# Patient Record
Sex: Female | Born: 1935 | Race: White | Hispanic: No
Health system: Southern US, Community
[De-identification: ages and names within clinical notes are randomized; demographics above are authoritative.]

## PROBLEM LIST (undated history)

## (undated) DIAGNOSIS — I509 Heart failure, unspecified: Secondary | ICD-10-CM

## (undated) DIAGNOSIS — I10 Essential (primary) hypertension: Secondary | ICD-10-CM

---

## 2003-08-29 ENCOUNTER — Ambulatory Visit (HOSPITAL_COMMUNITY): Admission: RE | Admit: 2003-08-29 | Discharge: 2003-08-29 | Payer: Self-pay | Admitting: Pulmonary Disease

## 2004-03-10 ENCOUNTER — Other Ambulatory Visit: Admission: RE | Admit: 2004-03-10 | Discharge: 2004-03-10 | Payer: Self-pay | Admitting: Dermatology

## 2004-12-07 ENCOUNTER — Inpatient Hospital Stay (HOSPITAL_COMMUNITY): Admission: EM | Admit: 2004-12-07 | Discharge: 2004-12-18 | Payer: Self-pay | Admitting: Emergency Medicine

## 2004-12-10 ENCOUNTER — Ambulatory Visit: Payer: Self-pay | Admitting: *Deleted

## 2005-02-23 ENCOUNTER — Ambulatory Visit: Payer: Self-pay | Admitting: Cardiology

## 2005-03-23 ENCOUNTER — Ambulatory Visit: Payer: Self-pay | Admitting: Cardiology

## 2005-03-30 ENCOUNTER — Ambulatory Visit: Payer: Self-pay | Admitting: *Deleted

## 2005-04-15 ENCOUNTER — Ambulatory Visit: Payer: Self-pay | Admitting: *Deleted

## 2005-05-06 ENCOUNTER — Ambulatory Visit: Payer: Self-pay | Admitting: *Deleted

## 2005-06-10 ENCOUNTER — Ambulatory Visit: Payer: Self-pay | Admitting: *Deleted

## 2005-07-09 ENCOUNTER — Ambulatory Visit: Payer: Self-pay | Admitting: *Deleted

## 2005-08-14 ENCOUNTER — Ambulatory Visit: Payer: Self-pay | Admitting: Cardiology

## 2005-08-25 ENCOUNTER — Ambulatory Visit: Payer: Self-pay | Admitting: *Deleted

## 2005-09-10 ENCOUNTER — Ambulatory Visit: Payer: Self-pay | Admitting: Internal Medicine

## 2005-09-15 ENCOUNTER — Ambulatory Visit: Payer: Self-pay | Admitting: *Deleted

## 2005-10-02 ENCOUNTER — Ambulatory Visit: Payer: Self-pay | Admitting: Cardiology

## 2005-11-05 ENCOUNTER — Ambulatory Visit (HOSPITAL_COMMUNITY): Admission: RE | Admit: 2005-11-05 | Discharge: 2005-11-05 | Payer: Self-pay | Admitting: Cardiology

## 2005-11-05 ENCOUNTER — Ambulatory Visit: Payer: Self-pay | Admitting: Cardiology

## 2005-12-28 ENCOUNTER — Ambulatory Visit (HOSPITAL_COMMUNITY): Admission: RE | Admit: 2005-12-28 | Discharge: 2005-12-28 | Payer: Self-pay | Admitting: Ophthalmology

## 2006-06-14 ENCOUNTER — Ambulatory Visit (HOSPITAL_COMMUNITY): Admission: RE | Admit: 2006-06-14 | Discharge: 2006-06-14 | Payer: Self-pay | Admitting: Ophthalmology

## 2009-11-26 ENCOUNTER — Inpatient Hospital Stay (HOSPITAL_COMMUNITY): Admission: EM | Admit: 2009-11-26 | Discharge: 2009-11-28 | Payer: Self-pay | Admitting: Emergency Medicine

## 2009-11-28 ENCOUNTER — Inpatient Hospital Stay: Admission: AD | Admit: 2009-11-28 | Discharge: 2010-03-04 | Payer: Self-pay | Admitting: Family Medicine

## 2009-12-11 ENCOUNTER — Ambulatory Visit (HOSPITAL_COMMUNITY): Admission: RE | Admit: 2009-12-11 | Discharge: 2009-12-11 | Payer: Self-pay | Admitting: Obstetrics and Gynecology

## 2010-01-08 ENCOUNTER — Ambulatory Visit (HOSPITAL_COMMUNITY): Admission: RE | Admit: 2010-01-08 | Discharge: 2010-01-08 | Payer: Self-pay | Admitting: Orthopaedic Surgery

## 2010-01-21 ENCOUNTER — Ambulatory Visit (HOSPITAL_COMMUNITY): Admission: RE | Admit: 2010-01-21 | Discharge: 2010-01-21 | Payer: Self-pay | Admitting: Orthopaedic Surgery

## 2010-02-11 ENCOUNTER — Encounter (HOSPITAL_COMMUNITY): Admission: RE | Admit: 2010-02-11 | Discharge: 2010-03-13 | Payer: Self-pay | Admitting: Pulmonary Disease

## 2010-02-12 ENCOUNTER — Ambulatory Visit (HOSPITAL_COMMUNITY): Admission: RE | Admit: 2010-02-12 | Discharge: 2010-02-12 | Payer: Self-pay | Admitting: Orthopaedic Surgery

## 2010-03-19 ENCOUNTER — Ambulatory Visit (HOSPITAL_COMMUNITY): Admission: RE | Admit: 2010-03-19 | Discharge: 2010-03-19 | Payer: Self-pay | Admitting: Orthopaedic Surgery

## 2010-04-27 ENCOUNTER — Inpatient Hospital Stay (HOSPITAL_COMMUNITY)
Admission: EM | Admit: 2010-04-27 | Discharge: 2010-05-01 | Payer: Self-pay | Source: Home / Self Care | Admitting: Emergency Medicine

## 2010-04-28 ENCOUNTER — Ambulatory Visit: Payer: Self-pay | Admitting: Cardiology

## 2010-05-20 ENCOUNTER — Emergency Department (HOSPITAL_COMMUNITY)
Admission: EM | Admit: 2010-05-20 | Discharge: 2010-05-21 | Payer: Self-pay | Source: Home / Self Care | Admitting: Emergency Medicine

## 2010-06-13 ENCOUNTER — Inpatient Hospital Stay (HOSPITAL_COMMUNITY): Admission: EM | Admit: 2010-06-13 | Discharge: 2010-06-18 | Payer: Self-pay | Admitting: Emergency Medicine

## 2010-06-16 IMAGING — CR DG ANKLE COMPLETE 3+V*R*
3 series · 3 of 3 positions shown · non-contrast
Comparison: Ankle radiograph 01/21/2010

CLINICAL DATA: Follow-up right ankle fracture

RIGHT ANKLE - COMPLETE 3+ VIEW

[view not recorded (1 of 3)]
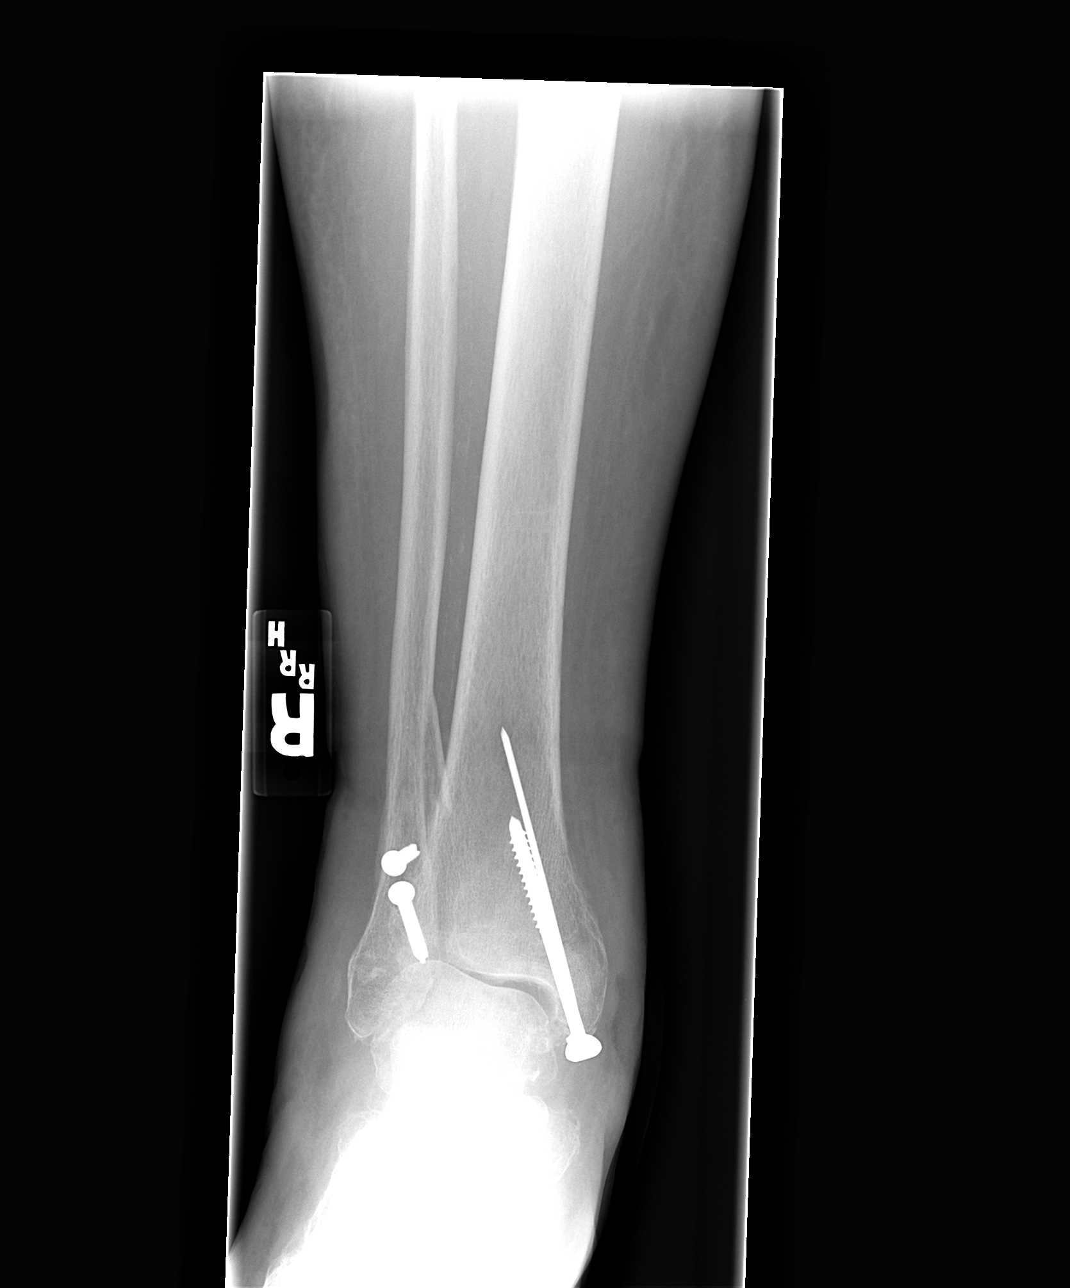

[view not recorded (2 of 3)]
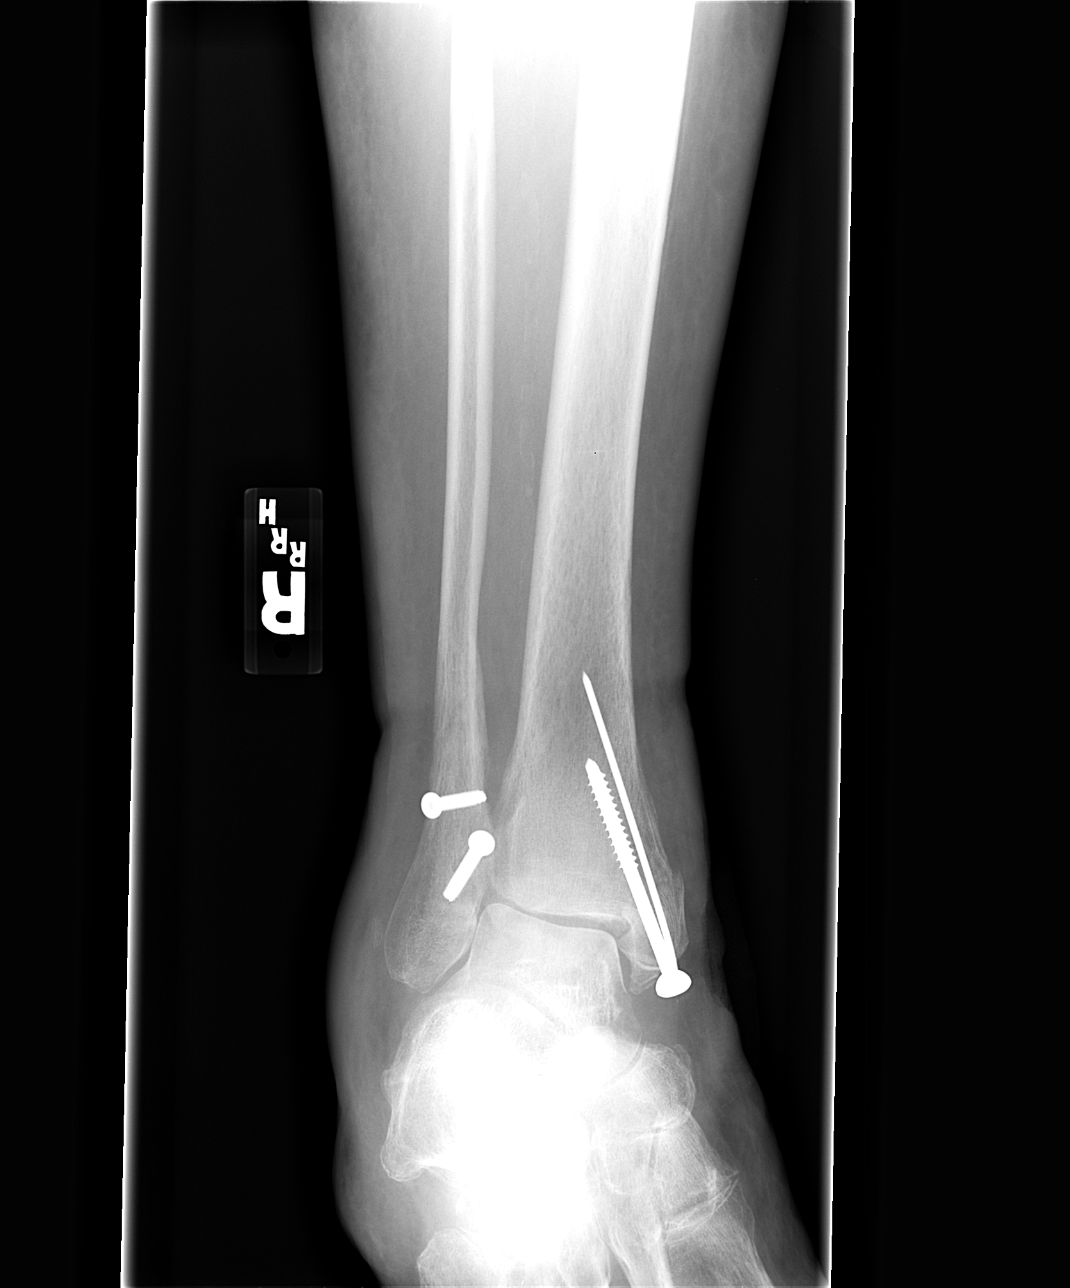

[view not recorded (3 of 3)]
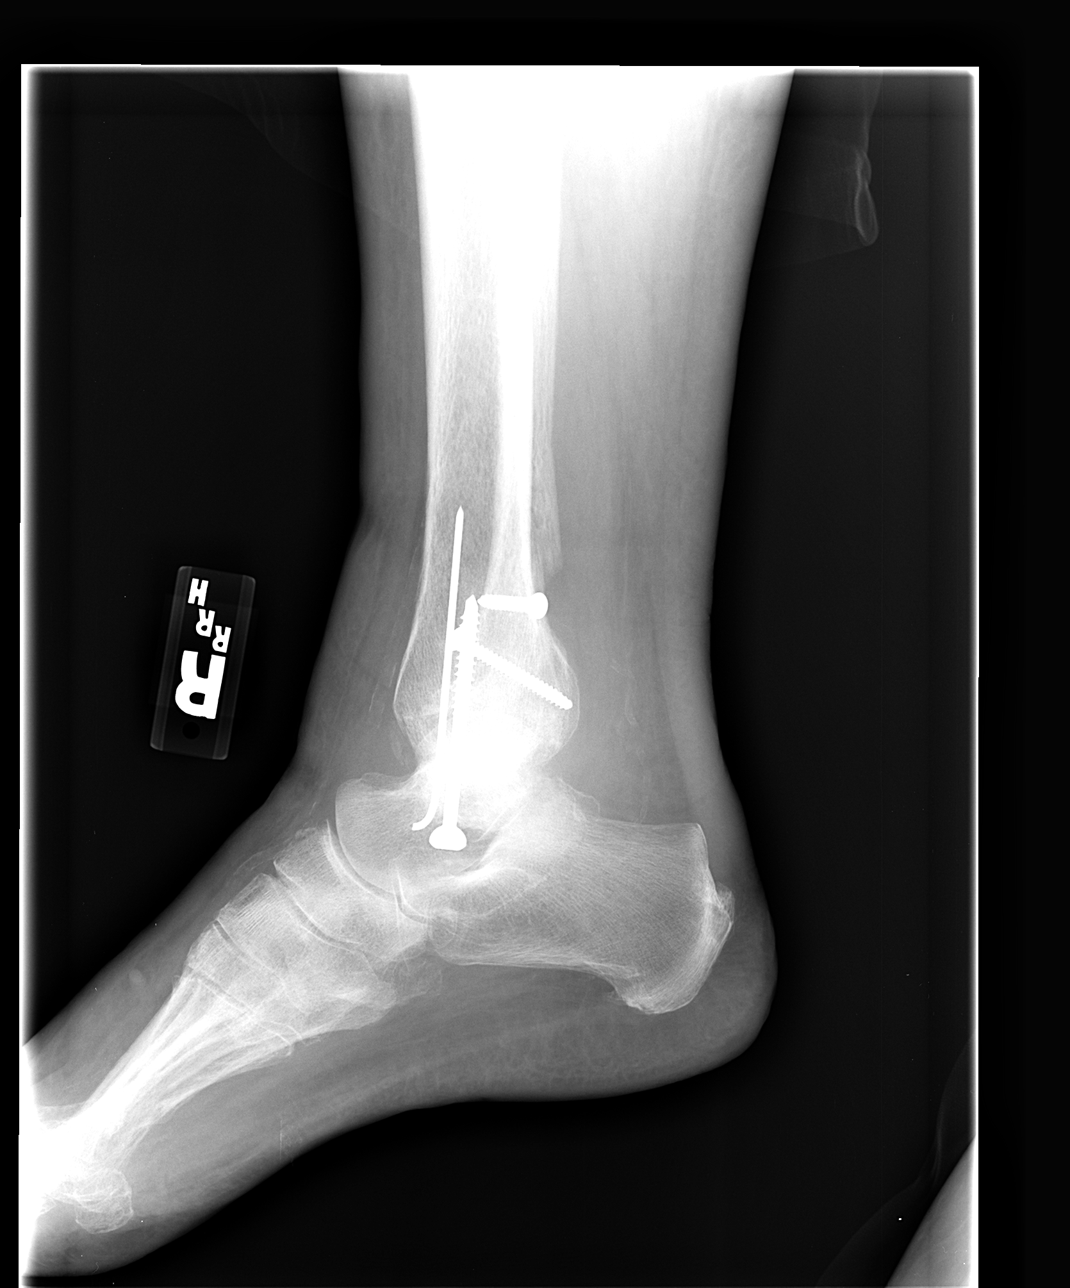

[3 of 3 positions shown; findings below may reference images not displayed]

FINDINGS: Stable appearance of the two screws in the distal fibula
and and screw and 10 in the medial malleolus.

There is persistent mild widening of the mortise medially.  Medial
malleolar fracture lines are still visible and unchanged.  The
distal fibula fracture lines are not well visualized, however there
is slight medial displacement of a fracture fragment just superior
to the level of the screws, stable.

No interval change compared to 01/21/2010.
IMPRESSION: 1.  Incomplete old bleed healed medial malleolus fracture, with
stable orthopedic hardware.
2.  Slight medial displacement of a fracture fragment of the distal
fibula, unchanged.
3.  Persistent mild widening of the mortise.

## 2010-07-05 ENCOUNTER — Emergency Department (HOSPITAL_COMMUNITY): Admission: EM | Admit: 2010-07-05 | Discharge: 2010-07-05 | Payer: Self-pay | Admitting: Emergency Medicine

## 2010-07-21 IMAGING — CR DG ANKLE COMPLETE 3+V*R*
3 series · 3 of 3 positions shown · non-contrast
Comparison: 02/12/2010

CLINICAL DATA: Status post trimalleolar fracture right ankle
October 2009, open wound

RIGHT ANKLE - COMPLETE 3+ VIEW

[view not recorded (1 of 3)]
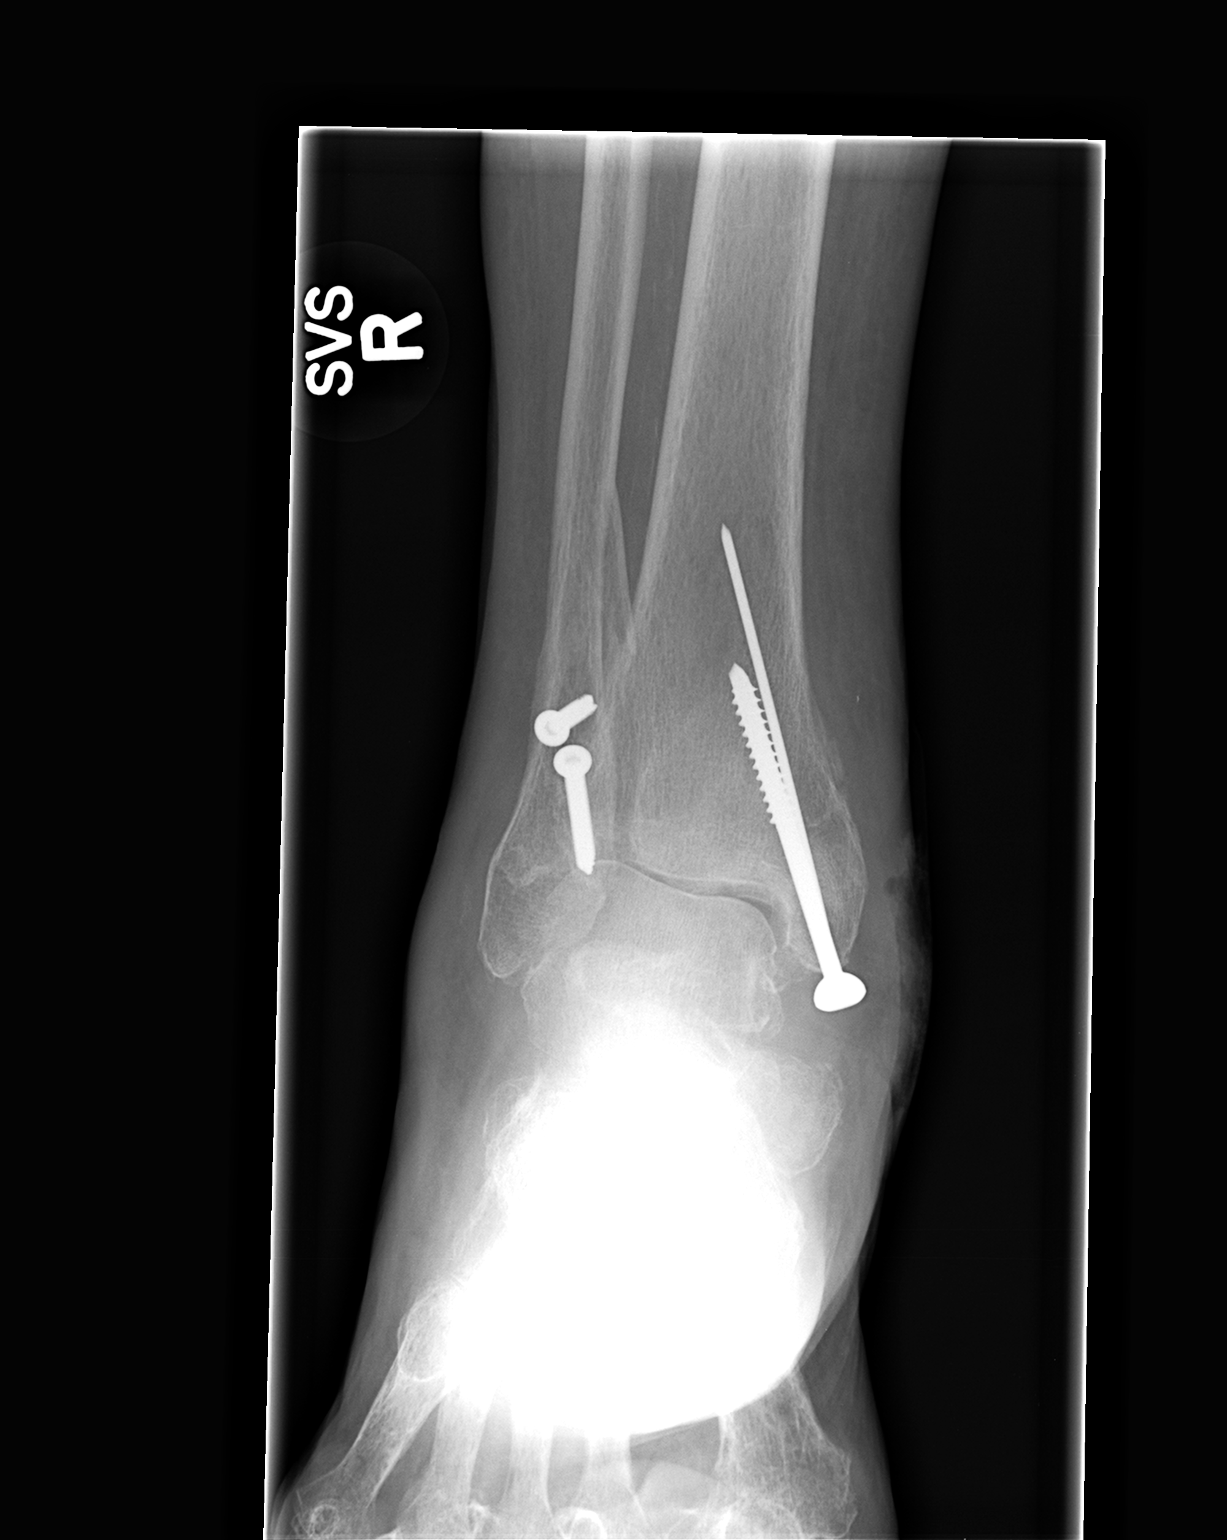

[view not recorded (2 of 3)]
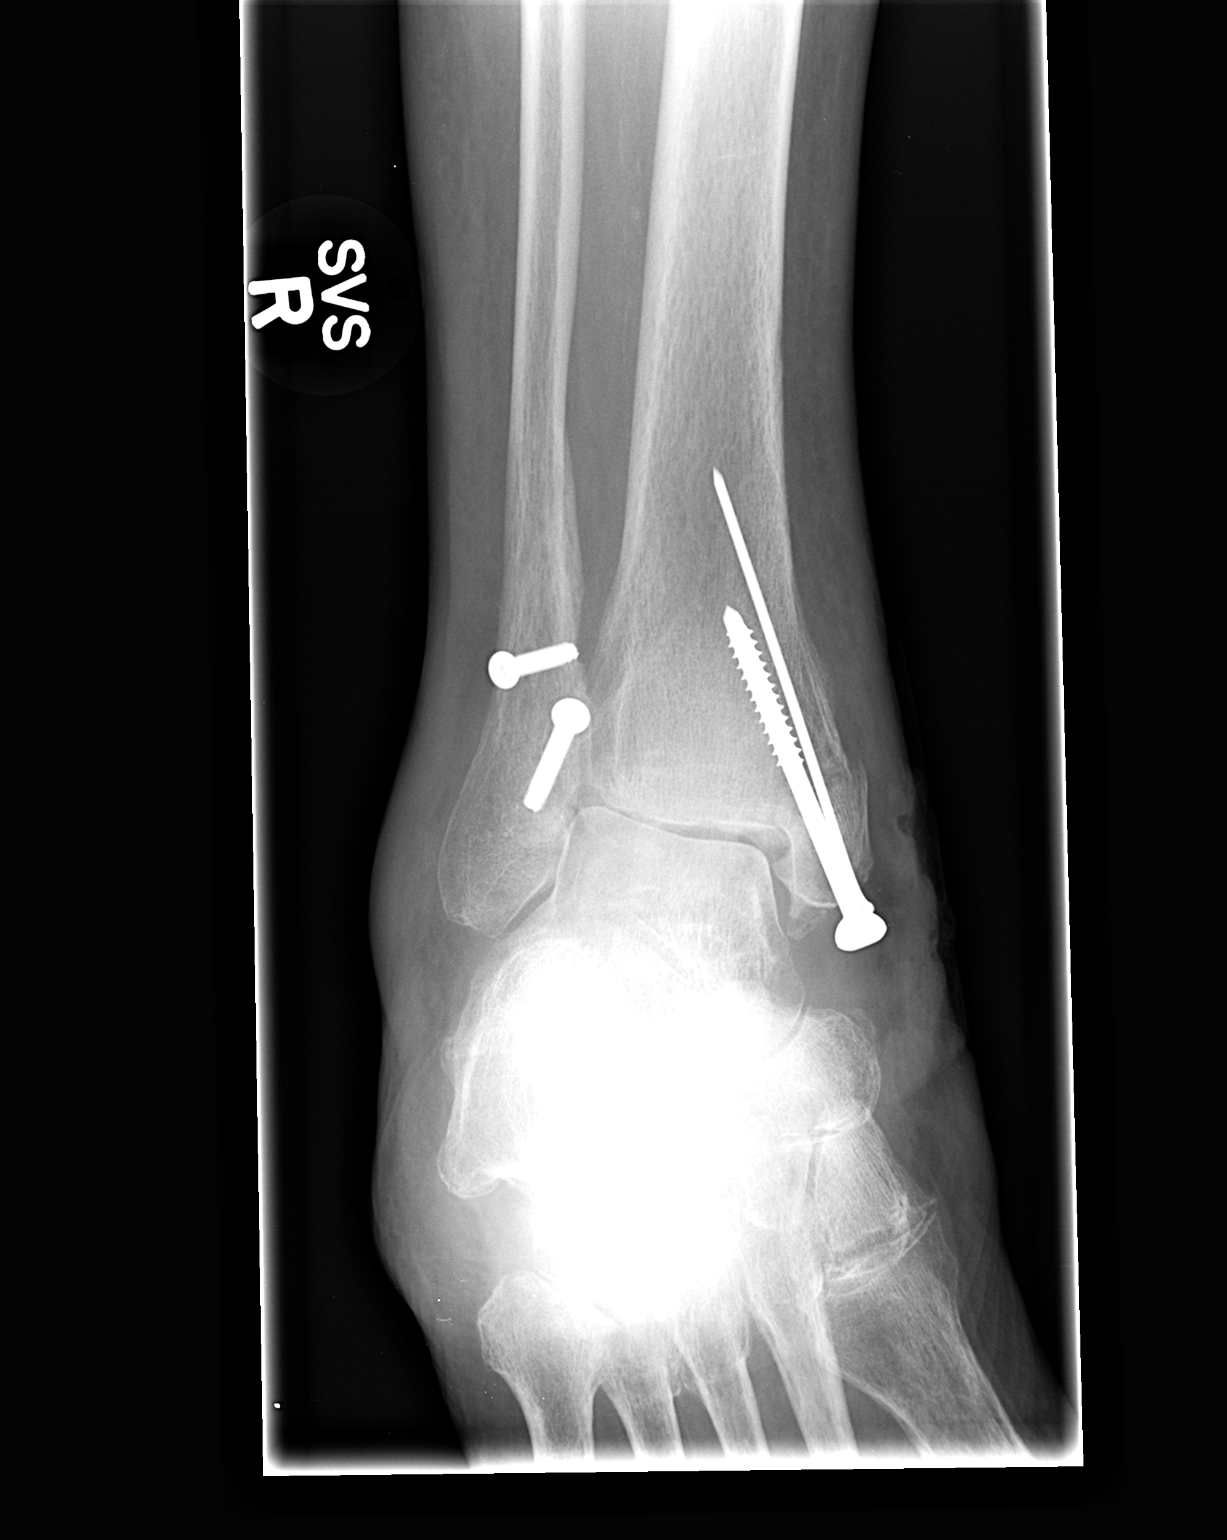

[view not recorded (3 of 3)]
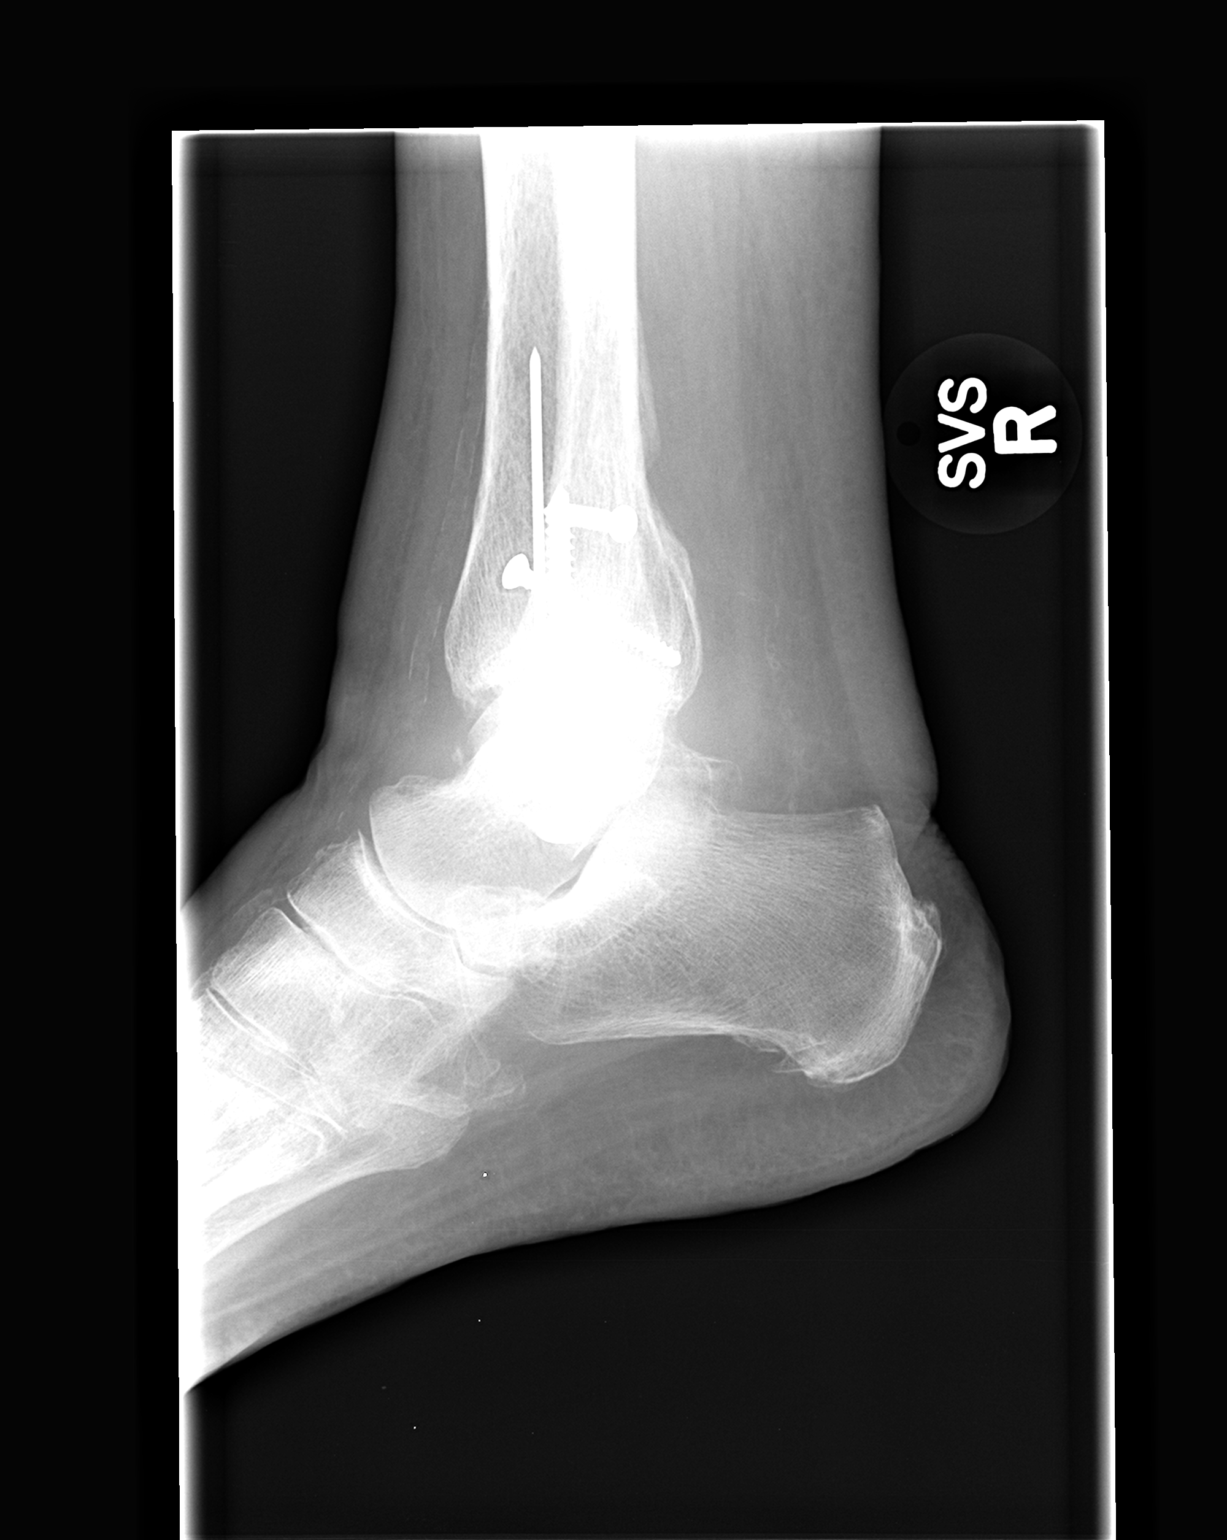

[3 of 3 positions shown; findings below may reference images not displayed]

FINDINGS: Pin and screw identified across medial malleolus.
Two screws present at distal fibula.
Interval continued healing of trimalleolar fractures.
Regional soft tissue swelling.
Mild scattered small vessel vascular calcification.
Minor calcaneal spurring.
Soft tissue irregularity and questionable ulceration at medial
aspect of the right ankle overly the medial malleolus.
No definite destructive bony process or periosteal reaction seen to
suggest acute osteomyelitis.
Mild asymmetric narrowing of the lateral aspect of the ankle
mortise.
IMPRESSION: Healing trimalleolar fractures right ankle, though medial malleolar
fracture plane remains radiographically evident.
Suspect soft tissue ulcer at medial soft tissues overlying medial
malleolus, without definite bony destruction seen to suggest acute
osteomyelitis.

## 2010-08-02 ENCOUNTER — Emergency Department (HOSPITAL_COMMUNITY): Admission: EM | Admit: 2010-08-02 | Discharge: 2010-08-02 | Payer: Self-pay | Admitting: Emergency Medicine

## 2010-09-02 ENCOUNTER — Ambulatory Visit (HOSPITAL_COMMUNITY): Admission: RE | Admit: 2010-09-02 | Discharge: 2010-09-02 | Payer: Self-pay | Admitting: Pulmonary Disease

## 2011-01-16 ENCOUNTER — Emergency Department (HOSPITAL_COMMUNITY)
Admission: EM | Admit: 2011-01-16 | Discharge: 2011-01-16 | Disposition: A | Discharge status: Home / Self Care | Payer: Medicare Other | Attending: Emergency Medicine | Admitting: Emergency Medicine

## 2011-01-16 ENCOUNTER — Emergency Department (HOSPITAL_COMMUNITY): Payer: Medicare Other

## 2011-01-16 DIAGNOSIS — F29 Unspecified psychosis not due to a substance or known physiological condition: Secondary | ICD-10-CM | POA: Insufficient documentation

## 2011-01-16 DIAGNOSIS — Z8673 Personal history of transient ischemic attack (TIA), and cerebral infarction without residual deficits: Secondary | ICD-10-CM | POA: Insufficient documentation

## 2011-01-16 DIAGNOSIS — I509 Heart failure, unspecified: Secondary | ICD-10-CM | POA: Insufficient documentation

## 2011-01-16 DIAGNOSIS — Z79899 Other long term (current) drug therapy: Secondary | ICD-10-CM | POA: Insufficient documentation

## 2011-01-16 DIAGNOSIS — I4891 Unspecified atrial fibrillation: Secondary | ICD-10-CM | POA: Insufficient documentation

## 2011-01-16 DIAGNOSIS — E119 Type 2 diabetes mellitus without complications: Secondary | ICD-10-CM | POA: Insufficient documentation

## 2011-01-16 DIAGNOSIS — I10 Essential (primary) hypertension: Secondary | ICD-10-CM | POA: Insufficient documentation

## 2011-01-16 DIAGNOSIS — R4182 Altered mental status, unspecified: Secondary | ICD-10-CM | POA: Insufficient documentation

## 2011-01-16 DIAGNOSIS — R404 Transient alteration of awareness: Secondary | ICD-10-CM | POA: Insufficient documentation

## 2011-01-16 LAB — URINALYSIS, ROUTINE W REFLEX MICROSCOPIC
Bilirubin Urine: NEGATIVE
Ketones, ur: NEGATIVE mg/dL
Nitrite: NEGATIVE
Specific Gravity, Urine: 1.02 (ref 1.005–1.030)
Urobilinogen, UA: 0.2 mg/dL (ref 0.0–1.0)

## 2011-01-16 LAB — CK TOTAL AND CKMB (NOT AT ARMC): Total CK: 17 U/L (ref 7–177)

## 2011-01-16 LAB — COMPREHENSIVE METABOLIC PANEL
ALT: 12 U/L (ref 0–35)
Alkaline Phosphatase: 38 U/L — ABNORMAL LOW (ref 39–117)
BUN: 32 mg/dL — ABNORMAL HIGH (ref 6–23)
CO2: 25 mEq/L (ref 19–32)
Chloride: 107 mEq/L (ref 96–112)
Glucose, Bld: 130 mg/dL — ABNORMAL HIGH (ref 70–99)
Potassium: 3.4 mEq/L — ABNORMAL LOW (ref 3.5–5.1)
Sodium: 139 mEq/L (ref 135–145)
Total Bilirubin: 0.6 mg/dL (ref 0.3–1.2)

## 2011-01-16 LAB — CBC
HCT: 41.3 % (ref 36.0–46.0)
Hemoglobin: 13.4 g/dL (ref 12.0–15.0)
MCH: 28 pg (ref 26.0–34.0)
MCHC: 32.4 g/dL (ref 30.0–36.0)
MCV: 86.4 fL (ref 78.0–100.0)
RBC: 4.78 MIL/uL (ref 3.87–5.11)

## 2011-01-16 LAB — DIFFERENTIAL
Basophils Relative: 0 % (ref 0–1)
Lymphs Abs: 2.2 10*3/uL (ref 0.7–4.0)
Monocytes Absolute: 0.9 10*3/uL (ref 0.1–1.0)
Monocytes Relative: 9 % (ref 3–12)
Neutro Abs: 5.8 10*3/uL (ref 1.7–7.7)

## 2011-01-16 LAB — TROPONIN I

## 2011-01-16 LAB — URINE MICROSCOPIC-ADD ON

## 2011-01-17 LAB — TSH: TSH: 2.614 u[IU]/mL (ref 0.350–4.500)

## 2011-01-18 LAB — URINE CULTURE
Culture  Setup Time: 201202180115
Culture: NO GROWTH

## 2011-02-13 LAB — URINE MICROSCOPIC-ADD ON

## 2011-02-13 LAB — COMPREHENSIVE METABOLIC PANEL
ALT: 18 U/L (ref 0–35)
AST: 24 U/L (ref 0–37)
Alkaline Phosphatase: 34 U/L — ABNORMAL LOW (ref 39–117)
CO2: 29 mEq/L (ref 19–32)
Chloride: 101 mEq/L (ref 96–112)
GFR calc non Af Amer: 55 mL/min — ABNORMAL LOW (ref >60)
Glucose, Bld: 96 mg/dL (ref 70–99)
Potassium: 4.1 mEq/L (ref 3.5–5.1)
Sodium: 137 mEq/L (ref 135–145)
Total Bilirubin: 0.8 mg/dL (ref 0.3–1.2)

## 2011-02-13 LAB — URINALYSIS, ROUTINE W REFLEX MICROSCOPIC
Bilirubin Urine: NEGATIVE
Ketones, ur: NEGATIVE mg/dL
Specific Gravity, Urine: 1.01 (ref 1.005–1.030)
pH: 7 (ref 5.0–8.0)

## 2011-02-13 LAB — DIFFERENTIAL
Basophils Absolute: 0 10*3/uL (ref 0.0–0.1)
Eosinophils Absolute: 0.6 10*3/uL (ref 0.0–0.7)
Eosinophils Relative: 8 % — ABNORMAL HIGH (ref 0–5)
Lymphocytes Relative: 26 % (ref 12–46)
Monocytes Absolute: 0.7 10*3/uL (ref 0.1–1.0)

## 2011-02-13 LAB — CBC
Hemoglobin: 13.2 g/dL (ref 12.0–15.0)
Platelets: 359 10*3/uL (ref 150–400)
RBC: 4.68 MIL/uL (ref 3.87–5.11)
WBC: 6.8 10*3/uL (ref 4.0–10.5)

## 2011-02-14 LAB — BASIC METABOLIC PANEL
BUN: 22 mg/dL (ref 6–23)
CO2: 25 mEq/L (ref 19–32)
CO2: 28 mEq/L (ref 19–32)
Calcium: 8.8 mg/dL (ref 8.4–10.5)
Calcium: 9 mg/dL (ref 8.4–10.5)
Calcium: 9.2 mg/dL (ref 8.4–10.5)
Chloride: 99 mEq/L (ref 96–112)
Creatinine, Ser: 0.93 mg/dL (ref 0.4–1.2)
Creatinine, Ser: 0.98 mg/dL (ref 0.4–1.2)
Creatinine, Ser: 1.15 mg/dL (ref 0.4–1.2)
GFR calc Af Amer: >60 mL/min (ref >60)
GFR calc Af Amer: >60 mL/min (ref >60)
GFR calc non Af Amer: 45 mL/min — ABNORMAL LOW (ref >60)
GFR calc non Af Amer: 57 mL/min — ABNORMAL LOW (ref >60)
Glucose, Bld: 120 mg/dL — ABNORMAL HIGH (ref 70–99)
Glucose, Bld: 124 mg/dL — ABNORMAL HIGH (ref 70–99)
Potassium: 3.9 mEq/L (ref 3.5–5.1)

## 2011-02-14 LAB — GLUCOSE, CAPILLARY
Glucose-Capillary: 122 mg/dL — ABNORMAL HIGH (ref 70–99)
Glucose-Capillary: 125 mg/dL — ABNORMAL HIGH (ref 70–99)
Glucose-Capillary: 135 mg/dL — ABNORMAL HIGH (ref 70–99)
Glucose-Capillary: 136 mg/dL — ABNORMAL HIGH (ref 70–99)
Glucose-Capillary: 137 mg/dL — ABNORMAL HIGH (ref 70–99)
Glucose-Capillary: 98 mg/dL (ref 70–99)

## 2011-02-14 LAB — CBC
MCH: 27.7 pg (ref 26.0–34.0)
MCH: 27.9 pg (ref 26.0–34.0)
MCH: 27.9 pg (ref 26.0–34.0)
MCHC: 33.4 g/dL (ref 30.0–36.0)
MCV: 82.6 fL (ref 78.0–100.0)
Platelets: 322 10*3/uL (ref 150–400)
Platelets: 325 10*3/uL (ref 150–400)
Platelets: 334 10*3/uL (ref 150–400)
RBC: 4.12 MIL/uL (ref 3.87–5.11)
RBC: 4.29 MIL/uL (ref 3.87–5.11)
RDW: 17.9 % — ABNORMAL HIGH (ref 11.5–15.5)
WBC: 6.5 10*3/uL (ref 4.0–10.5)
WBC: 9.4 10*3/uL (ref 4.0–10.5)

## 2011-02-14 LAB — DIFFERENTIAL
Basophils Absolute: 0 10*3/uL (ref 0.0–0.1)
Basophils Relative: 1 % (ref 0–1)
Eosinophils Absolute: 0.2 10*3/uL (ref 0.0–0.7)
Eosinophils Absolute: 0.3 10*3/uL (ref 0.0–0.7)
Eosinophils Absolute: 0.4 10*3/uL (ref 0.0–0.7)
Eosinophils Relative: 3 % (ref 0–5)
Lymphocytes Relative: 21 % (ref 12–46)
Lymphocytes Relative: 22 % (ref 12–46)
Lymphs Abs: 2 10*3/uL (ref 0.7–4.0)
Neutro Abs: 6.2 10*3/uL (ref 1.7–7.7)
Neutrophils Relative %: 61 % (ref 43–77)
Neutrophils Relative %: 67 % (ref 43–77)

## 2011-02-14 LAB — PREPARE FRESH FROZEN PLASMA

## 2011-02-14 LAB — URINALYSIS, ROUTINE W REFLEX MICROSCOPIC
Glucose, UA: NEGATIVE mg/dL
Ketones, ur: NEGATIVE mg/dL
Nitrite: NEGATIVE
Specific Gravity, Urine: 1.02 (ref 1.005–1.030)
pH: 6 (ref 5.0–8.0)

## 2011-02-14 LAB — CULTURE, BLOOD (ROUTINE X 2): Culture: NO GROWTH

## 2011-02-14 LAB — URINE CULTURE
Colony Count: NO GROWTH
Culture: NO GROWTH

## 2011-02-14 LAB — PROTIME-INR
INR: 1.81 — ABNORMAL HIGH (ref 0.00–1.49)
INR: 1.86 — ABNORMAL HIGH (ref 0.00–1.49)
INR: >10 (ref 0.00–1.49)
Prothrombin Time: 20.8 seconds — ABNORMAL HIGH (ref 11.6–15.2)
Prothrombin Time: 21.3 seconds — ABNORMAL HIGH (ref 11.6–15.2)

## 2011-02-14 LAB — VANCOMYCIN, TROUGH: Vancomycin Tr: 11.5 ug/mL (ref 10.0–20.0)

## 2011-02-15 LAB — BASIC METABOLIC PANEL
GFR calc non Af Amer: 39 mL/min — ABNORMAL LOW (ref >60)
Glucose, Bld: 169 mg/dL — ABNORMAL HIGH (ref 70–99)
Potassium: 3.8 mEq/L (ref 3.5–5.1)
Sodium: 136 mEq/L (ref 135–145)

## 2011-02-15 LAB — GLUCOSE, CAPILLARY
Glucose-Capillary: 107 mg/dL — ABNORMAL HIGH (ref 70–99)
Glucose-Capillary: 113 mg/dL — ABNORMAL HIGH (ref 70–99)
Glucose-Capillary: 117 mg/dL — ABNORMAL HIGH (ref 70–99)
Glucose-Capillary: 135 mg/dL — ABNORMAL HIGH (ref 70–99)
Glucose-Capillary: 136 mg/dL — ABNORMAL HIGH (ref 70–99)
Glucose-Capillary: 138 mg/dL — ABNORMAL HIGH (ref 70–99)
Glucose-Capillary: 99 mg/dL (ref 70–99)
Glucose-Capillary: 104 mg/dL — ABNORMAL HIGH (ref 70–99)
Glucose-Capillary: 105 mg/dL — ABNORMAL HIGH (ref 70–99)
Glucose-Capillary: 105 mg/dL — ABNORMAL HIGH (ref 70–99)
Glucose-Capillary: 110 mg/dL — ABNORMAL HIGH (ref 70–99)
Glucose-Capillary: 112 mg/dL — ABNORMAL HIGH (ref 70–99)
Glucose-Capillary: 119 mg/dL — ABNORMAL HIGH (ref 70–99)
Glucose-Capillary: 125 mg/dL — ABNORMAL HIGH (ref 70–99)
Glucose-Capillary: 166 mg/dL — ABNORMAL HIGH (ref 70–99)
Glucose-Capillary: 93 mg/dL (ref 70–99)

## 2011-02-15 LAB — CBC
HCT: 37.6 % (ref 36.0–46.0)
Hemoglobin: 12.5 g/dL (ref 12.0–15.0)
RDW: 17.4 % — ABNORMAL HIGH (ref 11.5–15.5)
WBC: 6.3 10*3/uL (ref 4.0–10.5)

## 2011-02-15 LAB — DIFFERENTIAL
Eosinophils Relative: 6 % — ABNORMAL HIGH (ref 0–5)
Lymphocytes Relative: 24 % (ref 12–46)
Lymphs Abs: 1.5 10*3/uL (ref 0.7–4.0)
Monocytes Absolute: 0.7 10*3/uL (ref 0.1–1.0)
Monocytes Relative: 11 % (ref 3–12)

## 2011-02-16 LAB — COMPREHENSIVE METABOLIC PANEL
ALT: 22 U/L (ref 0–35)
AST: 21 U/L (ref 0–37)
Albumin: 3.9 g/dL (ref 3.5–5.2)
Alkaline Phosphatase: 40 U/L (ref 39–117)
Chloride: 98 mEq/L (ref 96–112)
GFR calc Af Amer: 57 mL/min — ABNORMAL LOW (ref >60)
Potassium: 3.9 mEq/L (ref 3.5–5.1)
Sodium: 137 mEq/L (ref 135–145)
Total Bilirubin: 0.4 mg/dL (ref 0.3–1.2)

## 2011-02-16 LAB — GLUCOSE, CAPILLARY
Glucose-Capillary: 105 mg/dL — ABNORMAL HIGH (ref 70–99)
Glucose-Capillary: 121 mg/dL — ABNORMAL HIGH (ref 70–99)
Glucose-Capillary: 92 mg/dL (ref 70–99)
Glucose-Capillary: 97 mg/dL (ref 70–99)
Glucose-Capillary: 72 mg/dL (ref 70–99)
Glucose-Capillary: 85 mg/dL (ref 70–99)
Glucose-Capillary: 87 mg/dL (ref 70–99)

## 2011-02-16 LAB — PROTIME-INR
INR: 1.09 (ref 0.00–1.49)
Prothrombin Time: 14 seconds (ref 11.6–15.2)
Prothrombin Time: 14.3 seconds (ref 11.6–15.2)

## 2011-02-16 LAB — URINE CULTURE: Colony Count: NO GROWTH

## 2011-02-16 LAB — TROPONIN I: Troponin I: 0.02 ng/mL (ref 0.00–0.06)

## 2011-02-16 LAB — CBC
Platelets: 333 10*3/uL (ref 150–400)
WBC: 7 10*3/uL (ref 4.0–10.5)

## 2011-02-16 LAB — DIFFERENTIAL
Basophils Absolute: 0 10*3/uL (ref 0.0–0.1)
Basophils Relative: 1 % (ref 0–1)
Eosinophils Relative: 5 % (ref 0–5)
Lymphocytes Relative: 24 % (ref 12–46)
Monocytes Absolute: 1 10*3/uL (ref 0.1–1.0)

## 2011-02-16 LAB — URINALYSIS, ROUTINE W REFLEX MICROSCOPIC
Bilirubin Urine: NEGATIVE
Hgb urine dipstick: NEGATIVE
Specific Gravity, Urine: <1.005 — ABNORMAL LOW (ref 1.005–1.030)
pH: 7 (ref 5.0–8.0)

## 2011-02-16 LAB — CK TOTAL AND CKMB (NOT AT ARMC): CK, MB: 0.7 ng/mL (ref 0.3–4.0)

## 2011-02-16 LAB — APTT: aPTT: 30 seconds (ref 24–37)

## 2011-02-18 LAB — GLUCOSE, CAPILLARY
Glucose-Capillary: 104 mg/dL — ABNORMAL HIGH (ref 70–99)
Glucose-Capillary: 109 mg/dL — ABNORMAL HIGH (ref 70–99)
Glucose-Capillary: 112 mg/dL — ABNORMAL HIGH (ref 70–99)
Glucose-Capillary: 115 mg/dL — ABNORMAL HIGH (ref 70–99)
Glucose-Capillary: 116 mg/dL — ABNORMAL HIGH (ref 70–99)
Glucose-Capillary: 116 mg/dL — ABNORMAL HIGH (ref 70–99)
Glucose-Capillary: 118 mg/dL — ABNORMAL HIGH (ref 70–99)
Glucose-Capillary: 118 mg/dL — ABNORMAL HIGH (ref 70–99)
Glucose-Capillary: 118 mg/dL — ABNORMAL HIGH (ref 70–99)
Glucose-Capillary: 119 mg/dL — ABNORMAL HIGH (ref 70–99)
Glucose-Capillary: 128 mg/dL — ABNORMAL HIGH (ref 70–99)
Glucose-Capillary: 131 mg/dL — ABNORMAL HIGH (ref 70–99)
Glucose-Capillary: 131 mg/dL — ABNORMAL HIGH (ref 70–99)
Glucose-Capillary: 146 mg/dL — ABNORMAL HIGH (ref 70–99)
Glucose-Capillary: 147 mg/dL — ABNORMAL HIGH (ref 70–99)
Glucose-Capillary: 86 mg/dL (ref 70–99)
Glucose-Capillary: 96 mg/dL (ref 70–99)

## 2011-02-22 LAB — GLUCOSE, CAPILLARY
Glucose-Capillary: 100 mg/dL — ABNORMAL HIGH (ref 70–99)
Glucose-Capillary: 101 mg/dL — ABNORMAL HIGH (ref 70–99)
Glucose-Capillary: 104 mg/dL — ABNORMAL HIGH (ref 70–99)
Glucose-Capillary: 106 mg/dL — ABNORMAL HIGH (ref 70–99)
Glucose-Capillary: 106 mg/dL — ABNORMAL HIGH (ref 70–99)
Glucose-Capillary: 108 mg/dL — ABNORMAL HIGH (ref 70–99)
Glucose-Capillary: 116 mg/dL — ABNORMAL HIGH (ref 70–99)
Glucose-Capillary: 119 mg/dL — ABNORMAL HIGH (ref 70–99)
Glucose-Capillary: 121 mg/dL — ABNORMAL HIGH (ref 70–99)
Glucose-Capillary: 122 mg/dL — ABNORMAL HIGH (ref 70–99)
Glucose-Capillary: 124 mg/dL — ABNORMAL HIGH (ref 70–99)
Glucose-Capillary: 124 mg/dL — ABNORMAL HIGH (ref 70–99)
Glucose-Capillary: 86 mg/dL (ref 70–99)
Glucose-Capillary: 95 mg/dL (ref 70–99)

## 2011-03-02 LAB — BASIC METABOLIC PANEL
BUN: 18 mg/dL (ref 6–23)
BUN: 19 mg/dL (ref 6–23)
CO2: 29 mEq/L (ref 19–32)
CO2: 29 mEq/L (ref 19–32)
CO2: 29 mEq/L (ref 19–32)
Calcium: 8.6 mg/dL (ref 8.4–10.5)
Calcium: 9.4 mg/dL (ref 8.4–10.5)
Chloride: 95 mEq/L — ABNORMAL LOW (ref 96–112)
Chloride: 96 mEq/L (ref 96–112)
Creatinine, Ser: 0.96 mg/dL (ref 0.4–1.2)
Creatinine, Ser: 1.07 mg/dL (ref 0.4–1.2)
GFR calc Af Amer: 56 mL/min — ABNORMAL LOW (ref >60)
GFR calc Af Amer: 59 mL/min — ABNORMAL LOW (ref >60)
GFR calc Af Amer: >60 mL/min (ref >60)
GFR calc non Af Amer: 46 mL/min — ABNORMAL LOW (ref >60)
Glucose, Bld: 177 mg/dL — ABNORMAL HIGH (ref 70–99)
Potassium: 3.3 mEq/L — ABNORMAL LOW (ref 3.5–5.1)
Potassium: 3.9 mEq/L (ref 3.5–5.1)
Sodium: 130 mEq/L — ABNORMAL LOW (ref 135–145)
Sodium: 137 mEq/L (ref 135–145)

## 2011-03-02 LAB — DIFFERENTIAL
Basophils Relative: 0 % (ref 0–1)
Eosinophils Absolute: 0.2 10*3/uL (ref 0.0–0.7)
Eosinophils Absolute: 0.4 10*3/uL (ref 0.0–0.7)
Eosinophils Relative: 2 % (ref 0–5)
Eosinophils Relative: 4 % (ref 0–5)
Lymphocytes Relative: 11 % — ABNORMAL LOW (ref 12–46)
Lymphocytes Relative: 13 % (ref 12–46)
Lymphs Abs: 1.4 10*3/uL (ref 0.7–4.0)
Lymphs Abs: 1.6 10*3/uL (ref 0.7–4.0)
Monocytes Absolute: 1.1 10*3/uL — ABNORMAL HIGH (ref 0.1–1.0)
Monocytes Relative: 10 % (ref 3–12)
Monocytes Relative: 11 % (ref 3–12)
Monocytes Relative: 7 % (ref 3–12)
Neutro Abs: 11.4 10*3/uL — ABNORMAL HIGH (ref 1.7–7.7)
Neutro Abs: 8.8 10*3/uL — ABNORMAL HIGH (ref 1.7–7.7)
Neutrophils Relative %: 75 % (ref 43–77)
Neutrophils Relative %: 80 % — ABNORMAL HIGH (ref 43–77)

## 2011-03-02 LAB — CBC
HCT: 27.8 % — ABNORMAL LOW (ref 36.0–46.0)
HCT: 32.6 % — ABNORMAL LOW (ref 36.0–46.0)
Hemoglobin: 9.9 g/dL — ABNORMAL LOW (ref 12.0–15.0)
MCHC: 34.5 g/dL (ref 30.0–36.0)
MCV: 88.1 fL (ref 78.0–100.0)
MCV: 89 fL (ref 78.0–100.0)
Platelets: 240 10*3/uL (ref 150–400)
RBC: 3.15 MIL/uL — ABNORMAL LOW (ref 3.87–5.11)
RBC: 3.32 MIL/uL — ABNORMAL LOW (ref 3.87–5.11)
RBC: 4.28 MIL/uL (ref 3.87–5.11)
WBC: 11 10*3/uL — ABNORMAL HIGH (ref 4.0–10.5)
WBC: 11.7 10*3/uL — ABNORMAL HIGH (ref 4.0–10.5)
WBC: 14.2 10*3/uL — ABNORMAL HIGH (ref 4.0–10.5)

## 2011-03-02 LAB — GLUCOSE, CAPILLARY
Glucose-Capillary: 104 mg/dL — ABNORMAL HIGH (ref 70–99)
Glucose-Capillary: 149 mg/dL — ABNORMAL HIGH (ref 70–99)
Glucose-Capillary: 167 mg/dL — ABNORMAL HIGH (ref 70–99)
Glucose-Capillary: 175 mg/dL — ABNORMAL HIGH (ref 70–99)
Glucose-Capillary: 190 mg/dL — ABNORMAL HIGH (ref 70–99)

## 2011-03-02 LAB — URINE CULTURE
Colony Count: NO GROWTH
Culture: NO GROWTH
Special Requests: POSITIVE

## 2011-03-10 ENCOUNTER — Encounter (HOSPITAL_COMMUNITY): Payer: Self-pay | Admitting: Radiology

## 2011-03-10 ENCOUNTER — Emergency Department (HOSPITAL_COMMUNITY): Payer: Medicare Other

## 2011-03-10 ENCOUNTER — Emergency Department (HOSPITAL_COMMUNITY)
Admission: EM | Admit: 2011-03-10 | Discharge: 2011-03-10 | Disposition: A | Discharge status: Home / Self Care | Payer: Medicare Other | Attending: Emergency Medicine | Admitting: Emergency Medicine

## 2011-03-10 DIAGNOSIS — R5381 Other malaise: Secondary | ICD-10-CM | POA: Insufficient documentation

## 2011-03-10 DIAGNOSIS — H5450 Low vision, one eye, unspecified eye: Secondary | ICD-10-CM | POA: Insufficient documentation

## 2011-03-10 DIAGNOSIS — I509 Heart failure, unspecified: Secondary | ICD-10-CM | POA: Insufficient documentation

## 2011-03-10 DIAGNOSIS — I10 Essential (primary) hypertension: Secondary | ICD-10-CM | POA: Insufficient documentation

## 2011-03-10 DIAGNOSIS — Z8673 Personal history of transient ischemic attack (TIA), and cerebral infarction without residual deficits: Secondary | ICD-10-CM | POA: Insufficient documentation

## 2011-03-10 DIAGNOSIS — E119 Type 2 diabetes mellitus without complications: Secondary | ICD-10-CM | POA: Insufficient documentation

## 2011-03-10 DIAGNOSIS — I4891 Unspecified atrial fibrillation: Secondary | ICD-10-CM | POA: Insufficient documentation

## 2011-03-10 DIAGNOSIS — E860 Dehydration: Secondary | ICD-10-CM | POA: Insufficient documentation

## 2011-03-10 HISTORY — DX: Essential (primary) hypertension: I10

## 2011-03-10 HISTORY — DX: Heart failure, unspecified: I50.9

## 2011-03-10 LAB — URINALYSIS, ROUTINE W REFLEX MICROSCOPIC
Bilirubin Urine: NEGATIVE
Ketones, ur: NEGATIVE mg/dL
Leukocytes, UA: NEGATIVE
Nitrite: NEGATIVE
Specific Gravity, Urine: >1.03 — ABNORMAL HIGH (ref 1.005–1.030)
Urobilinogen, UA: 0.2 mg/dL (ref 0.0–1.0)

## 2011-03-10 LAB — DIFFERENTIAL
Basophils Absolute: 0.1 10*3/uL (ref 0.0–0.1)
Basophils Relative: 1 % (ref 0–1)
Eosinophils Absolute: 0.4 10*3/uL (ref 0.0–0.7)
Eosinophils Relative: 4 % (ref 0–5)
Lymphocytes Relative: 18 % (ref 12–46)
Monocytes Absolute: 1.3 10*3/uL — ABNORMAL HIGH (ref 0.1–1.0)

## 2011-03-10 LAB — CBC
HCT: 44.4 % (ref 36.0–46.0)
MCHC: 32.2 g/dL (ref 30.0–36.0)
Platelets: 364 10*3/uL (ref 150–400)
RDW: 15.2 % (ref 11.5–15.5)
WBC: 10.8 10*3/uL — ABNORMAL HIGH (ref 4.0–10.5)

## 2011-03-10 LAB — BASIC METABOLIC PANEL
Calcium: 9.2 mg/dL (ref 8.4–10.5)
GFR calc non Af Amer: 42 mL/min — ABNORMAL LOW (ref >60)
Glucose, Bld: 125 mg/dL — ABNORMAL HIGH (ref 70–99)
Sodium: 136 mEq/L (ref 135–145)

## 2011-03-10 LAB — URINE MICROSCOPIC-ADD ON

## 2011-04-17 NOTE — Group Therapy Note (Signed)
NAMERUBIE, FICCO              ACCOUNT NO.:  [id number]   MEDICAL RECORD NO.:  [id number]          PATIENT TYPE:  INP   LOCATION:  A220                          FACILITY:  [facility]   PHYSICIAN:  Edward L. Juanetta Gosling, M.D.DATE OF BIRTH:  Aug 01, 1924   DATE OF PROCEDURE:  DATE OF DISCHARGE:                                   PROGRESS NOTE   PROBLEMS:  1.  Pneumonia.  2.  Atrial fibrillation.  3.  Diabetes.   SUBJECTIVE:  Ms. Buley says she feels great and wants to go home.  She has  no new complaints.   OBJECTIVE:  VITAL SIGNS:  Temperature 97.9, pulse 81, respirations 18.  Blood sugar 172, was as high as 228.  Blood pressure 142/82, O2 saturation  95% on 2 L.  CHEST:  Clearer.  HEART:  Regular.   ASSESSMENT:  She does seem to be doing better.   PLAN:  I am going to go ahead and discharge her home today.  Her blood sugar  is not perfect, but as I told her, she is going to be more active more home,  change her diet.  We are going to have home health services to monitor her  blood sugar, etc.  Her blood pressure is better, and we will follow along  from there.     Edwa   ELH/MEDQ  D:  12/18/2004  T:  12/18/2004  Job:  [id number]

## 2011-04-17 NOTE — Discharge Summary (Signed)
Kristina [name], Kristina [name]              ACCOUNT NO.:  [id number]   MEDICAL RECORD NO.:  [id number]          PATIENT TYPE:  INP   LOCATION:  A220                          FACILITY:  [facility]   PHYSICIAN:  Edward L. Juanetta Gosling, M.D.DATE OF BIRTH:  10-24-1924   DATE OF ADMISSION:  12/07/2004  DATE OF DISCHARGE:  LH                                 DISCHARGE SUMMARY   FINAL DIAGNOSES:  1.  Pneumonia.  2.  Hypertension.  3.  New onset atrial fibrillation reverted to sinus rhythm.  4.  Diabetes, new onset.  5.  Hyperlipidemia.  6.  Congestive heart failure.  7.  Electrolyte abnormalities.   HISTORY:  Kristina [name] is a 77 year old who has had hypertension and  hyperlipidemia.  She had been in her usual state of fair health until about  10 days prior to her admission, at which time she developed what seemed to  be an upper respiratory infection.  She was seen and started on Levaquin and  Codiclear, but then she started having nausea and vomiting.  She was not  able to keep any of her medications down by mouth and came to the emergency  room where she was found to pneumonia.   PHYSICAL EXAMINATION:  GENERAL APPEARANCE:  She was acutely ill.  Mildly  confused, probably from Phenergan.  She was dyspneic.  VITAL SIGNS:  O2 saturation 88% on room air and 97% on 2 L.  Blood pressure  initially 219/76 and pulse 92.  HEENT:  Pupils were reactive.  Mucous membranes were moist.  CHEST:  Rhonchi bilaterally.  HEART:  Regular.  ABDOMEN:  Soft.   LABORATORY DATA:  The sodium was 130, BUN 12 and creatinine 0.7.  The chest  x-ray showed a left lower lobe pneumonia.   HOSPITAL COURSE:  She was admitted and started on albuterol, Atrovent and  antibiotics.  She then developed a rapid heart rate which was shown to be  atrial fibrillation.  She was transferred to the second floor and had  evidence of the atrial fibrillation.  She had multiple cardiac enzymes done  which were negative for infarction.  A consultation  was obtained with the  cardiology team.  She was treated with Cardizem and Toprol and eventually  improved.  She was anticoagulated.  She is improved enough now that she is  able to go home.  She developed diabetes or at least her diabetes was  discovered while she was in the hospital.  We had gotten better control of  her sugars by the time was discharged home.   DISPOSITION:  She is now discharged home.  She is going to have home health  services.   DISCHARGE MEDICATIONS:  1.  Avapro 300 mg daily.  2.  Pravachol 40 mg daily.  3.  Tenex 2 mg at bedtime.  4.  Tricor 145 mg daily.  5.  Coumadin 5 mg daily around 1800 hours.  6.  Glucotrol 5 mg daily.  7.  Metoprolol 25 mg b.i.d.  8.  Diltiazem CD 360 mg daily.  9.  Lasix 40 mg daily.  10. Potassium  chloride 20 mEq daily.     Edwa   ELH/MEDQ  D:  12/18/2004  T:  12/18/2004  Job:  [id number]

## 2011-04-17 NOTE — Consult Note (Signed)
[name], [name]              ACCOUNT NO.:  [id number]   MEDICAL RECORD NO.:  [id number]          PATIENT TYPE:  INP   LOCATION:  A220                          FACILITY:  [facility]   PHYSICIAN:  Vida Roller, M.D.   DATE OF BIRTH:  November 08, 1924   DATE OF CONSULTATION:  12/10/2004  DATE OF DISCHARGE:                                   CONSULTATION   PRIMARY CARE PHYSICIAN:  Oneal Deputy. Juanetta Gosling, M.D.   HISTORY OF PRESENT ILLNESS:  Kristina [name] is a 77 year old woman who has a past  medical history of hypertension, hyperlipidemia who was admitted to [facility] on December 07, 2004 with an upper respiratory infection thought to be  secondary to pneumonia.  She had had upper respiratory symptoms for about 10  days prior to admission, productive cough.  She was treated as an outpatient  with Levaquin and did not get any better so she was admitted to the hospital  with a diagnosis presumptively of pneumonia.  Over the course of her  hospitalization, she developed palpitations and shortness of breath this  morning.  Her EKG showed her to be in atrial fibrillation with rapid  ventricular response.  There were no EKGs from her admission previous.  She  was transferred to the tele bed and we were asked to manage her care.  She  doesn't have any chest pain.  Her shortness of breath has actually improved  since she has been in the hospital.  The palpitations are resolving slowly.  She has no paroxysmal nocturnal dyspnea or orthopnea.  She has experienced a  little bit of lower extremity edema since she has had the IV.   PAST MEDICAL HISTORY:  Significant for hypertension, hyperlipidemia.  She  has had a hysterectomy in the past.  She lives in [place] by herself.  She is retired.  She has no children.  She does not smoke.  She rarely uses  alcohol.  She does not use any illicit drugs.   FAMILY HISTORY:  Her mother died at age 75 of heart problems thought to be  rheumatic heart disease.  Her father  died age 37 of a stroke.  She has one  brother who has no significant heart disease.   REVIEW OF SYSTEMS:  Generally negative except for that reviewed in history  of present illness.   MEDICATIONS PRIOR TO ADMISSION:  1.  Levaquin 500 mg once a day.  2.  Pravacol 40 mg a day.  3.  Trichlor 145 mg once a day.  4.  Avapro 300 mg a day.  5.  Norvasc 10 mg a day.  6.  Lasix 40 mg a day.  7.  Tylenol as needed.  8.  Tenex 2 mg at bedtime.   Here in the hospital she is on  1.  Rocephin.  2.  Senekot.  3.  Solu-Medrol.  4.  Tyzak 240 once a day.  5.  Trichlor.  6.  Zithromax 500 mg IV once a day.  7.  Zocor.  8.  Diltiazem drip.   PHYSICAL EXAMINATION:  GENERAL:  She is  a well-developed, well-nourished,  somewhat elderly white female who looks slightly younger than her stated  age. VITAL SIGNS:  She is afebrile.  Her pulse is 120 and atrial  fibrillation.  Respiratory rate is 20.  Blood pressure is 153/82.  She is  saturating 96% on 2L nasal cannula.  HEENT:  Examination is unremarkable.  NECK:  Supple. There is no jugular venous distention or carotid bruits.  CARDIAC:  Irregularly irregular and tachycardic.  LUNGS:  She has decreased breath sounds primarily at the left base,  otherwise clear.  GU, RECTAL, BREAST:  All deferred.  EXTREMITIES:  She has +1 ankle edema, 2+ pulses, no clubbing or cyanosis.  NEUROLOGIC:  Grossly nonfocal.   LABORATORY DATA:  She had an echocardiogram which shows an ejection fraction  of 75 to 80% with no wall motion abnormalities and mild concentric left  ventricular hypertrophy with biatrial enlargement, mild mitral  regurgitation.  Chest x-ray shows left lower lobe atelectasis versus  pneumonia.  A little bit of cardiomegaly.  Electrocardiogram shows atrial  fibrillation at a rate of about 145 with normal axes, normal QRS duration,  nonspecific STT wave changes.  Laboratories on admission:  White blood cell  count 14,600, hemoglobin 10,  hematocrit 31 with platelet count 812,000.  Sodium 130, potassium 4.1, chloride 93, bicarb 27, BUN 12, creatinine 0.7,  blood sugar is 154.  She had blood cultures that were negative times three  days.   This is a lady who has atrial fibrillation in the setting of pulmonary  infection that I think we can probably control with medications.  Her left  ventricular systolic function appears normal.  She may benefit from a low  dose of a beta blocker.  We will see how she does with rate control.  I  anticipate she will probably convert back spontaneously to sinus rhythm.  I  think she probably needs to be anticoagulated.  She is on Lovenox subcu  currently and I think that is very reasonable.  She will probably need to be  converted over to Coumadin and this would be a drug I would use with her  abnormalities on her echocardiogram to achieve benefits from Coumadin for  stroke prophylaxis.  That and obviously, her age.  The other issue is her  thrombocytosis.  Her platelet count is quite elevated. I think she probably  benefits from repeat CBC to see if this is just an acute phase reaction or  if indeed she does have elevated platelets as this obviously increases her  risk for thrombosis and then finally, her blood pressure needs to be  slightly better controlled.     Trey Paula   JH/MEDQ  D:  12/10/2004  T:  12/10/2004  Job:  [id number]   cc:   Ramon Dredge L. Juanetta Gosling, M.D.  [address]  [place]  [place] [address]  Fax: [phone]

## 2011-04-17 NOTE — Procedures (Signed)
Kristina [name], Kristina [name]              ACCOUNT NO.:  [id number]   MEDICAL RECORD NO.:  [id number]          PATIENT TYPE:  INP   LOCATION:  A220                          FACILITY:  [facility]   PHYSICIAN:  Vida Roller, M.D.   DATE OF BIRTH:  Jul 20, 1924   DATE OF PROCEDURE:  12/10/2004  DATE OF DISCHARGE:                                  ECHOCARDIOGRAM   PRIMARY CARE PHYSICIAN:  Oneal Deputy. Juanetta Gosling, M.D   Tape Number [id number], Tape count [id number] through 1988.   This is a 77 year old woman with a pneumonia, hypertension, and atrial  fibrillation.   The technical quality study is difficult.   M-MODE TRACINGS:  1.  Aorta 23 mm.  2.  Left atrium is 44 mm.  3.  Septum is 13 mm.  4.  Posterior wall 11 mm.  5.  Left ventricular diastolic dimension 37 mm.  6.  Left jugular systolic dimension 23 mm.   2-D AND DOPPLER IMAGING:  The patient is in atrial fibrillation with rapid  ventricular response.   1.  The left ventricle is normal size with hyperdynamic left ventricular      systolic function. Estimated ejection fraction 75-80%. There is no      obvious wall motion abnormalities. There is mild concentric left      ventricular hypertrophy.  2.  The right ventricle appears to be normal size with normal systolic      function.  3.  There is biatrial enlargement.  4.  The aortic valve was sclerotic with no evidence of aortic stenosis or      regurgitation.  5.  There is mild mitral annular calcification with mild regurgitation.  6.  There is some mild tricuspid regurgitation.  7.  The inferior vena cava slightly dilated.  8.  There is no obvious pericardial effusion. There may be a prominent fat      pad.  9.  The ascending aorta is not well seen.     Trey Paula   JH/MEDQ  D:  12/10/2004  T:  12/10/2004  Job:  [id number]   cc:   Ramon Dredge L. Juanetta Gosling, M.D.  [address]  [place]  [place] [address]  Fax: [phone]

## 2011-04-17 NOTE — H&P (Signed)
Kristina [name], Kristina [name]              ACCOUNT NO.:  [id number]   MEDICAL RECORD NO.:  [id number]          PATIENT TYPE:  INP   LOCATION:  A326                          FACILITY:  [facility]   PHYSICIAN:  Hanley Hays. Dechurch, M.D.DATE OF BIRTH:  03/18/1924   DATE OF ADMISSION:  12/07/2004  DATE OF DISCHARGE:  LH                                HISTORY & PHYSICAL   The patient is a 77 year old Caucasian female with a past medical history of  hypertension, hyperlipidemia, who was in her usual state of health until  about 10 days prior, when she developed upper respiratory type of symptoms.  She was seen by her primary care physician on December 01, 2004 and treated  with Levaquin and Codiclear DH.  Three days prior to admission she developed  nausea and vomiting and thought it was secondary to the Codiclear.  She  generally felt worse.  She has been unable to maintain any p.o.'s.  She was  seen by family members today who felt that she needed further evaluation.  The patient denies any fever or chills but has felt flushed.  She complains  of being weak.  She denies any pain.  She has not had a bowel movement in  several days.  She states she has been unable to void urine as much as  usual.  She has had no falls.  Her mental status has been clear.  Usually  she lives independently without any assistance and here in the emergency  room is ill-appearing.   Chest x-ray in the emergency room reveals loss of left hemidiaphragm.  Low-  grade fever, temperature 99.9 with leukocytosis of 18,000 and a left shift.  She was being admitted to the hospital for community-acquired pneumonia with  failure to respond to Levaquin.   PAST MEDICAL HISTORY:  Hypertension and hyperlipidemia.   FAMILY MEDICAL HISTORY:  Noncontributory.   SOCIAL HISTORY:  No alcohol or tobacco abuse.  Lives independently.  Presents with her goddaughters.   PHYSICAL EXAMINATION:  GENERAL APPEARANCE:  Reveals an ill-appearing,  elderly  female who is slightly confused secondary to Phenergan, slightly  dyspneic with conversation but denies shortness of breath.  VITAL SIGNS:  Her O2 saturations are initially 88%, now 97% on 2 L.  Blood  pressure initially was 219/76 and is now 166/87.  Pulse was 90 and regular.  NECK:  Supple.  No JVD or adenopathy.  HEENT:  The oropharynx is moist.  LUNGS:  Reveal decreased breath sounds with rales at the left base and equal  breath sounds bilaterally.  ABDOMEN:  Obese, distended, tympanitic.  Positive bowel sounds.  Nontender.  HEART:  Regular.  Cannot appreciate a murmur or gallop.  EXTREMITIES:  Without clubbing, cyanosis or edema.  NEUROLOGIC:  She follows commands. Her speech is slightly slow, again due to  the medication, but otherwise appropriate.   LABORATORY DATA:  Sodium 130, BUN 12, creatinine 0.7.   ASSESSMENT/PLAN:  1.  Left lower lobe pneumonia, community acquired, not responsive to      Levaquin.  2.  Nausea and vomiting with rectal impaction by exam  in the emergency room.      She is having no abdominal pain.  Therefore further evaluation is not      indicated.  We will admit the patient.  She has received Rocephin and      Zithromax which will be continued.  Cultures are pending.  She has      little to no sputum production.  Continue oxygen therapy and albuterol      nebs q.4 while awake and then q.6h. p.r.n.  We will continue her usual      medications when she is able to maintain p.o.'s.  The plan is discussed      with the patient and accompanying family.     Drenda Freeze   FED/MEDQ  D:  12/07/2004  T:  12/07/2004  Job:  [id number]

## 2011-04-23 ENCOUNTER — Emergency Department (HOSPITAL_COMMUNITY): Payer: Medicare Other

## 2011-04-23 ENCOUNTER — Emergency Department (HOSPITAL_COMMUNITY)
Admission: EM | Admit: 2011-04-23 | Discharge: 2011-04-23 | Disposition: A | Discharge status: Home / Self Care | Payer: Medicare Other | Attending: Emergency Medicine | Admitting: Emergency Medicine

## 2011-04-23 DIAGNOSIS — G510 Bell's palsy: Secondary | ICD-10-CM | POA: Insufficient documentation

## 2011-04-23 DIAGNOSIS — I4891 Unspecified atrial fibrillation: Secondary | ICD-10-CM | POA: Insufficient documentation

## 2011-04-23 DIAGNOSIS — I509 Heart failure, unspecified: Secondary | ICD-10-CM | POA: Insufficient documentation

## 2011-04-23 DIAGNOSIS — M25559 Pain in unspecified hip: Secondary | ICD-10-CM | POA: Insufficient documentation

## 2011-04-23 DIAGNOSIS — R4182 Altered mental status, unspecified: Secondary | ICD-10-CM | POA: Insufficient documentation

## 2011-04-23 DIAGNOSIS — I10 Essential (primary) hypertension: Secondary | ICD-10-CM | POA: Insufficient documentation

## 2011-04-23 DIAGNOSIS — Z8673 Personal history of transient ischemic attack (TIA), and cerebral infarction without residual deficits: Secondary | ICD-10-CM | POA: Insufficient documentation

## 2011-04-23 DIAGNOSIS — E119 Type 2 diabetes mellitus without complications: Secondary | ICD-10-CM | POA: Insufficient documentation

## 2011-04-23 LAB — TROPONIN I: Troponin I: <0.3 ng/mL (ref <0.30)

## 2011-04-23 LAB — BASIC METABOLIC PANEL
Chloride: 109 mEq/L (ref 96–112)
Creatinine, Ser: 1.84 mg/dL — ABNORMAL HIGH (ref 0.4–1.2)
GFR calc Af Amer: 32 mL/min — ABNORMAL LOW (ref >60)
Potassium: 5.8 mEq/L — ABNORMAL HIGH (ref 3.5–5.1)
Sodium: 140 mEq/L (ref 135–145)

## 2011-04-23 LAB — CBC
MCH: 28.8 pg (ref 26.0–34.0)
MCV: 92.9 fL (ref 78.0–100.0)
Platelets: 298 10*3/uL (ref 150–400)
RDW: 17 % — ABNORMAL HIGH (ref 11.5–15.5)

## 2011-04-23 LAB — URINALYSIS, ROUTINE W REFLEX MICROSCOPIC
Glucose, UA: NEGATIVE mg/dL
Leukocytes, UA: NEGATIVE
Protein, ur: 100 mg/dL — AB
pH: 5 (ref 5.0–8.0)

## 2011-04-23 LAB — DIFFERENTIAL
Eosinophils Absolute: 0.2 10*3/uL (ref 0.0–0.7)
Eosinophils Relative: 2 % (ref 0–5)
Lymphs Abs: 2.7 10*3/uL (ref 0.7–4.0)
Monocytes Relative: 12 % (ref 3–12)

## 2011-04-23 LAB — URINE MICROSCOPIC-ADD ON

## 2011-04-24 LAB — URINE CULTURE

## 2011-05-01 DEATH — deceased
